# Patient Record
Sex: Male | Born: 1990 | Race: Black or African American | Hispanic: No | Marital: Single | Smoking: Current every day smoker
Health system: Southern US, Community
[De-identification: ages and names within clinical notes are randomized; demographics above are authoritative.]

## PROBLEM LIST (undated history)

## (undated) DIAGNOSIS — J45909 Unspecified asthma, uncomplicated: Secondary | ICD-10-CM

---

## 2016-06-16 ENCOUNTER — Emergency Department (HOSPITAL_COMMUNITY)
Admission: EM | Admit: 2016-06-16 | Discharge: 2016-06-16 | Disposition: A | Discharge status: Home / Self Care | Payer: Self-pay | Attending: Emergency Medicine | Admitting: Emergency Medicine

## 2016-06-16 ENCOUNTER — Emergency Department (HOSPITAL_COMMUNITY): Payer: Self-pay

## 2016-06-16 DIAGNOSIS — J069 Acute upper respiratory infection, unspecified: Secondary | ICD-10-CM | POA: Insufficient documentation

## 2016-06-16 MED ORDER — ALBUTEROL SULFATE HFA 108 (90 BASE) MCG/ACT IN AERS
1.0000 | INHALATION_SPRAY | RESPIRATORY_TRACT | Status: DC | PRN
  Administered 2016-06-16: 1 via RESPIRATORY_TRACT
  Filled 2016-06-16: qty 6.7

## 2016-06-16 MED ORDER — PREDNISONE 20 MG PO TABS: 40.0000 mg | ORAL_TABLET | Freq: Every day | ORAL | 0 refills | Status: AC

## 2016-06-16 MED ORDER — AEROCHAMBER PLUS W/MASK MISC
1.0000 | Freq: Once | Status: AC
  Administered 2016-06-16: 1
  Filled 2016-06-16: qty 1

## 2016-06-16 MED ORDER — BENZONATATE 100 MG PO CAPS: 100.0000 mg | ORAL_CAPSULE | Freq: Three times a day (TID) | ORAL | 0 refills | Status: AC | PRN

## 2016-06-16 MED ORDER — PREDNISONE 20 MG PO TABS
60.0000 mg | ORAL_TABLET | Freq: Once | ORAL | Status: AC
  Administered 2016-06-16: 60 mg via ORAL
  Filled 2016-06-16: qty 3

## 2016-06-16 NOTE — ED Triage Notes (Signed)
Cough and s/s x 2 days it has irritated his his bronchititis , OTC meds not helping

## 2016-06-16 NOTE — ED Notes (Signed)
Pt verbalized understanding discharge instructions and denies any further needs or questions at this time. VS stable, ambulatory and steady gait.   

## 2016-06-16 NOTE — ED Provider Notes (Signed)
[facility]EM[facility]Y DEPT Provider Note   CSN: [id number] Arrival date & time: 06/16/16  1249     History   Chief Complaint Chief Complaint  Patient presents with  . URI    HPI Eric Boyle is a 26 y.o. male.  HPI Patient presents for evaluation of gradual onset, persistent, mild cough. He reports a productive cough for the last 4 days that is becoming worse. He notes yellowish brown sputum. Associated symptoms include myalgias, chills, nasal congestion, shortness of breath, and diffuse chest pain with coughing. No sore throat. He has not tried any medications. He notes a remote history of asthma as a child.  No past medical history on file.  There are no active problems to display for this patient.   No past surgical history on file.     Home Medications    Prior to Admission medications   Medication Sig Start Date End Date Taking? Authorizing Provider  benzonatate (TESSALON) 100 MG capsule Take 1 capsule (100 mg total) by mouth 3 (three) times daily as needed for cough. 06/16/16   Cheri Fowler, PA-C  predniSONE (DELTASONE) 20 MG tablet Take 2 tablets (40 mg total) by mouth daily. 06/16/16   Cheri Fowler, PA-C    Family History No family history on file.  Social History Social History  Substance Use Topics  . Smoking status: Not on file  . Smokeless tobacco: Not on file  . Alcohol use Not on file     Allergies   Patient has no known allergies.   Review of Systems Review of Systems All other systems negative unless otherwise stated in HPI   Physical Exam Updated Vital Signs BP 123/67 (BP Location: Left Arm)   Pulse 78   Temp 98.6 F (37 C) (Oral)   Resp 21   Ht 6' (1.829 m)   Wt 95.3 kg   SpO2 99%   BMI 28.48 kg/m   Physical Exam  Constitutional: He is oriented to person, place, and time. He appears well-developed and well-nourished.  Non-toxic appearance. He does not have a sickly appearance. He does not appear ill.  HENT:  Head: Normocephalic and  atraumatic.  Mouth/Throat: Oropharynx is clear and moist.  Eyes: Conjunctivae are normal.  Neck: Normal range of motion. Neck supple.  Cardiovascular: Normal rate and regular rhythm.   Pulmonary/Chest: Effort normal. No accessory muscle usage or stridor. No respiratory distress. He has wheezes. He has no rhonchi. He has rales.  Abdominal: Soft. Bowel sounds are normal. He exhibits no distension. There is no tenderness.  Musculoskeletal: Normal range of motion.  Lymphadenopathy:    He has no cervical adenopathy.  Neurological: He is alert and oriented to person, place, and time.  Speech clear without dysarthria.  Skin: Skin is warm and dry.  Psychiatric: He has a normal mood and affect. His behavior is normal.     ED Treatments / Results  Labs (all labs ordered are listed, but only abnormal results are displayed) Labs Reviewed - No data to display  EKG  EKG Interpretation None       Radiology Dg Chest 2 View  Result Date: 06/16/2016 CLINICAL DATA:  Cough for 2 days EXAM: CHEST  2 VIEW COMPARISON:  None. FINDINGS: The heart size and mediastinal contours are within normal limits. Both lungs are clear. The visualized skeletal structures are unremarkable. IMPRESSION: No active cardiopulmonary disease. Electronically Signed   By: Elige Ko   On: 06/16/2016 14:23    Procedures Procedures (including critical care time)  Medications Ordered in ED Medications  predniSONE (DELTASONE) tablet 60 mg (not administered)  albuterol (PROVENTIL HFA;VENTOLIN HFA) 108 (90 Base) MCG/ACT inhaler 1 puff (not administered)  aerochamber plus with mask device 1 each (not administered)     Initial Impression / Assessment and Plan / ED Course  I have reviewed the triage vital signs and the nursing notes.  Pertinent labs & imaging results that were available during my care of the patient were reviewed by me and considered in my medical decision making (see chart for details).  Clinical Course      Pt symptoms consistent with URI. CXR negative for acute infiltrate. Pt will be discharged with symptomatic treatment.  Discussed return precautions.  Pt is hemodynamically stable & in NAD prior to discharge.   Final Clinical Impressions(s) / ED Diagnoses   Final diagnoses:  Upper respiratory tract infection, unspecified type    New Prescriptions New Prescriptions   BENZONATATE (TESSALON) 100 MG CAPSULE    Take 1 capsule (100 mg total) by mouth 3 (three) times daily as needed for cough.   PREDNISONE (DELTASONE) 20 MG TABLET    Take 2 tablets (40 mg total) by mouth daily.     Cheri FowlerKayla Latangela Mccomas, PA-C 06/16/16 1526    Shaune Pollackameron Isaacs, MD 06/18/16 [phone]

## 2016-06-16 NOTE — Discharge Instructions (Signed)
Your xray today is normal.  Your symptoms are likely viral in nature.  Take prednisone and the albuterol inhaler to help with shortness of breath.  Take the tessalon perles for cough.  [facility] to establish a primary care physician.  Return to the ED for worsening cough, shortness of breath, chest pain, fever, or any new or concerning symptoms.

## 2016-11-19 ENCOUNTER — Encounter (HOSPITAL_COMMUNITY): Payer: Self-pay | Admitting: Emergency Medicine

## 2016-11-19 ENCOUNTER — Emergency Department (HOSPITAL_COMMUNITY)
Admission: EM | Admit: 2016-11-19 | Discharge: 2016-11-19 | Disposition: A | Discharge status: Home / Self Care | Payer: Self-pay | Attending: Physician Assistant | Admitting: Physician Assistant

## 2016-11-19 DIAGNOSIS — J45909 Unspecified asthma, uncomplicated: Secondary | ICD-10-CM | POA: Insufficient documentation

## 2016-11-19 DIAGNOSIS — M791 Myalgia, unspecified site: Secondary | ICD-10-CM

## 2016-11-19 DIAGNOSIS — F17200 Nicotine dependence, unspecified, uncomplicated: Secondary | ICD-10-CM | POA: Insufficient documentation

## 2016-11-19 DIAGNOSIS — G4489 Other headache syndrome: Secondary | ICD-10-CM | POA: Insufficient documentation

## 2016-11-19 HISTORY — DX: Unspecified asthma, uncomplicated: J45.909

## 2016-11-19 LAB — CBC WITH DIFFERENTIAL/PLATELET
BASOS PCT: 1 %
Basophils Absolute: 0.1 10*3/uL (ref 0.0–0.1)
EOS PCT: 3 %
Eosinophils Absolute: 0.2 10*3/uL (ref 0.0–0.7)
HEMATOCRIT: 44.4 % (ref 39.0–52.0)
Hemoglobin: 15 g/dL (ref 13.0–17.0)
Lymphocytes Relative: 49 %
Lymphs Abs: 3 10*3/uL (ref 0.7–4.0)
MCH: 29 pg (ref 26.0–34.0)
MCHC: 33.8 g/dL (ref 30.0–36.0)
MCV: 85.9 fL (ref 78.0–100.0)
MONO ABS: 0.5 10*3/uL (ref 0.1–1.0)
MONOS PCT: 8 %
NEUTROS ABS: 2.4 10*3/uL (ref 1.7–7.7)
Neutrophils Relative %: 39 %
Platelets: 225 10*3/uL (ref 150–400)
RBC: 5.17 MIL/uL (ref 4.22–5.81)
RDW: 15.1 % (ref 11.5–15.5)
WBC: 6.2 10*3/uL (ref 4.0–10.5)

## 2016-11-19 LAB — BASIC METABOLIC PANEL
Anion gap: 8 (ref 5–15)
BUN: 10 mg/dL (ref 6–20)
CALCIUM: 8.7 mg/dL — AB (ref 8.9–10.3)
CO2: 24 mmol/L (ref 22–32)
CREATININE: 1.24 mg/dL (ref 0.61–1.24)
Chloride: 103 mmol/L (ref 101–111)
GFR calc non Af Amer: >60 mL/min (ref >60)
GLUCOSE: 106 mg/dL — AB (ref 65–99)
Potassium: 3.9 mmol/L (ref 3.5–5.1)
Sodium: 135 mmol/L (ref 135–145)

## 2016-11-19 LAB — URINALYSIS, ROUTINE W REFLEX MICROSCOPIC
Bilirubin Urine: NEGATIVE
GLUCOSE, UA: NEGATIVE mg/dL
HGB URINE DIPSTICK: NEGATIVE
KETONES UR: NEGATIVE mg/dL
LEUKOCYTES UA: NEGATIVE
Nitrite: NEGATIVE
PH: 6 (ref 5.0–8.0)
Protein, ur: NEGATIVE mg/dL
Specific Gravity, Urine: 1.029 (ref 1.005–1.030)

## 2016-11-19 MED ORDER — DOXYCYCLINE HYCLATE 100 MG PO CAPS: 100.0000 mg | ORAL_CAPSULE | Freq: Two times a day (BID) | ORAL | 0 refills | Status: AC

## 2016-11-19 MED ORDER — SODIUM CHLORIDE 0.9 % IV SOLN
INTRAVENOUS | Status: AC
  Administered 2016-11-19: 500 mL/h via INTRAVENOUS

## 2016-11-19 NOTE — Discharge Instructions (Signed)
Your symptoms may be due to a viral illness, however, since you had some type of insect bite and did not see what it was we are treating you for RMSF with Doxycycline while your blood test is pending. Return for worsening symptoms.

## 2016-11-19 NOTE — ED Provider Notes (Signed)
[facility] Provider Note   CSN: [id number] Arrival date & time: 11/19/16  1944   By signing my name below, I, Orpah CobbMaurice Copeland, attest that this documentation has been prepared under the direction and in the presence of [facility] Sherwin Hollingshed, NP-C. Electronically Signed: Orpah CobbMaurice Copeland , ED Scribe. 11/20/16. 5:05 PM.   History   Chief Complaint Chief Complaint  Patient presents with  . Generalized Body Aches    HPI Eric Boyle is a 26 y.o. male who presents to the Emergency Department complaining of constant, worsening generalized body aches with onset x4 days. Pt states that for the past x4 days he has been experiencing generalized body aches and headaches. He also reports fever and chills with the highest measured temperature being 101. Pt reportedly had an insect bite to the R arm which he states he is unsure what bit him. He reports headache, myalgia, nausea, neck pain, neck stiffness. Pt has taken Tylenol Cold, Ibuprofen with mild relief. He denies sore throat, ear pain, vomiting, diarrhea, frequency, dysuria, penile discharge, back pain. Pt denies concern for STD.    The history is provided by the patient. No language interpreter was used.    Past Medical History:  Diagnosis Date  . Asthma     There are no active problems to display for this patient.   History reviewed. No pertinent surgical history.     Home Medications    Prior to Admission medications   Medication Sig Start Date End Date Taking? Authorizing Provider  benzonatate (TESSALON) 100 MG capsule Take 1 capsule (100 mg total) by mouth 3 (three) times daily as needed for cough. 06/16/16   Cheri Fowlerose, Kayla, PA-C  doxycycline (VIBRAMYCIN) 100 MG capsule Take 1 capsule (100 mg total) by mouth 2 (two) times daily. 11/19/16   Janne NapoleonNeese, Delcenia Inman M, NP  predniSONE (DELTASONE) 20 MG tablet Take 2 tablets (40 mg total) by mouth daily. 06/16/16   Cheri Fowlerose, Kayla, PA-C    Family History No family history on file.  Social  History Social History  Substance Use Topics  . Smoking status: Current Every Day Smoker  . Smokeless tobacco: Current User  . Alcohol use No     Allergies   Patient has no known allergies.   Review of Systems Review of Systems  Constitutional: Positive for chills and fever.  HENT: Negative for congestion, ear pain and sore throat.   Respiratory: Negative for shortness of breath.   Cardiovascular: Negative for chest pain.  Gastrointestinal: Negative for abdominal pain, nausea and vomiting.  Genitourinary: Negative for discharge, dysuria and urgency.  Musculoskeletal: Positive for myalgias and neck pain. Negative for back pain.  Skin: Negative for rash.  Neurological: Positive for headaches.  Psychiatric/Behavioral: Negative for confusion. The patient is not nervous/anxious.      Physical Exam Updated Vital Signs BP 135/76   Pulse 92   Temp 100.1 F (37.8 C) (Oral)   Resp 16   Ht 6' (1.829 m)   Wt 99.8 kg (220 lb)   SpO2 99%   BMI 29.84 kg/m   Physical Exam  Constitutional: He is oriented to person, place, and time. He appears well-developed and well-nourished. No distress.  HENT:  Head: Normocephalic.  Right Ear: Tympanic membrane normal.  Left Ear: Tympanic membrane normal.  Nose: Nose normal.  Mouth/Throat: Uvula is midline, oropharynx is clear and moist and mucous membranes are normal. No posterior oropharyngeal edema or posterior oropharyngeal erythema.  Eyes: EOM are normal. Pupils are equal, round, and reactive to light.  Sclera clear.  Neck: Neck supple.  Full ROM of the neck w/o pain. No meningeal signs.  Cardiovascular: Normal rate and regular rhythm.   Pulmonary/Chest: Effort normal and breath sounds normal.  Lungs clear.  Abdominal: Soft. Bowel sounds are normal. There is no tenderness. There is no CVA tenderness.  Musculoskeletal: Normal range of motion.  Lymphadenopathy:    He has no cervical adenopathy.  Neurological: He is alert and oriented  to person, place, and time. No cranial nerve deficit.  Skin: Skin is warm and dry.  Small raised area to the upper R arm that is non-tender.  Psychiatric: He has a normal mood and affect.  Nursing note and vitals reviewed.    ED Treatments / Results   DIAGNOSTIC STUDIES: Oxygen Saturation is 98% on RA, normal by my interpretation.   COORDINATION OF CARE: 5:05 PM-Discussed next steps with pt. Pt verbalized understanding and is agreeable with the plan.    Labs (all labs ordered are listed, but only abnormal results are displayed) Labs Reviewed  BASIC METABOLIC PANEL - Abnormal; Notable for the following:       Result Value   Glucose, Bld 106 (*)    Calcium 8.7 (*)    All other components within normal limits  URINALYSIS, ROUTINE W REFLEX MICROSCOPIC  CBC WITH DIFFERENTIAL/PLATELET  ROCKY MTN SPOTTED FVR ABS PNL(IGG+IGM)     Radiology No results found.  Procedures Procedures (including critical care time)  Medications Ordered in ED Medications  0.9 %  sodium chloride infusion ( Intravenous Stopped 11/19/16 2219)     Initial Impression / Assessment and Plan / ED Course  I have reviewed the triage vital signs and the nursing notes.  Pertinent lab results that were available during my care of the patient were reviewed by me and considered in my medical decision making (see chart for details).   Final Clinical Impressions(s) / ED Diagnoses  26 y.o. male with headache and fever s/p insect bite ? Tick bite stable for d/c without meningeal signs, rash and does not appear toxic. Will treat with doxycycline and patient will f/u with PCP or return here for worsening symptoms. Return precautions discussed. Dr. Corlis Leak in to examine the patient and discuss plan of care.   Final diagnoses:  Myalgia  Other headache syndrome    New Prescriptions Discharge Medication List as of 11/19/2016 11:03 PM    START taking these medications   Details  doxycycline (VIBRAMYCIN) 100 MG  capsule Take 1 capsule (100 mg total) by mouth 2 (two) times daily., Starting Wed 11/19/2016, Print      I personally performed the services described in this documentation, which was scribed in my presence. The recorded information has been reviewed and is accurate.    Kerrie Buffalo [place], [place] 11/20/16 1710    Abelino Derrick, MD 11/21/16 1535

## 2016-11-19 NOTE — ED Triage Notes (Signed)
Pt reports generalized body aches, chills, and fever x 1 week. Highest fever 101. Pt has been taking tylenol for symptoms.

## 2016-11-22 LAB — ROCKY MTN SPOTTED FVR ABS PNL(IGG+IGM)
RMSF IGG: NEGATIVE
RMSF IgM: 0.25 index (ref 0.00–0.89)

## 2018-03-26 IMAGING — CR DG CHEST 2V
2 series · 2 of 2 positions shown · non-contrast
Comparison: None.

CLINICAL DATA: Cough for 2 days

EXAM:
CHEST  2 VIEW

[chest pa]
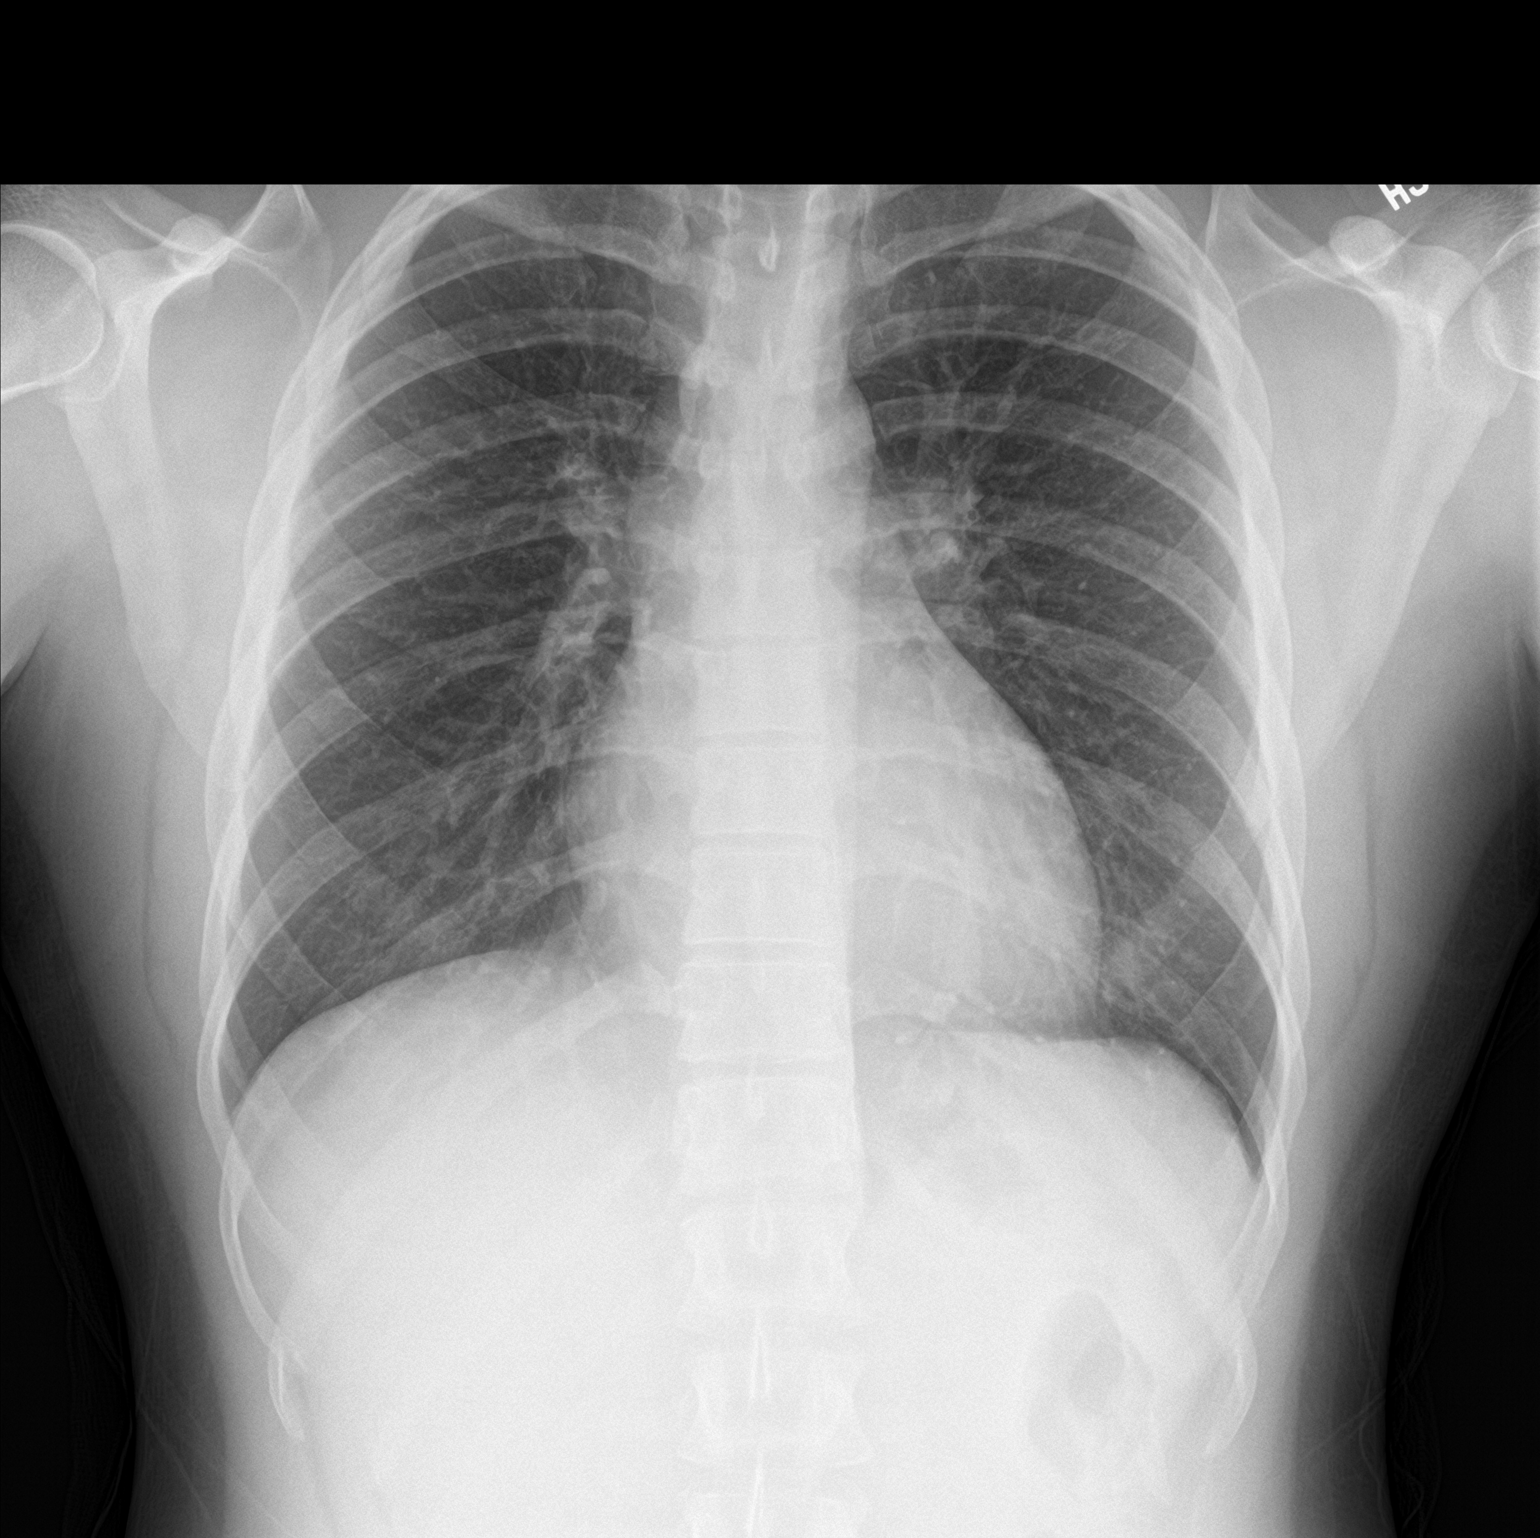

[chest lat]
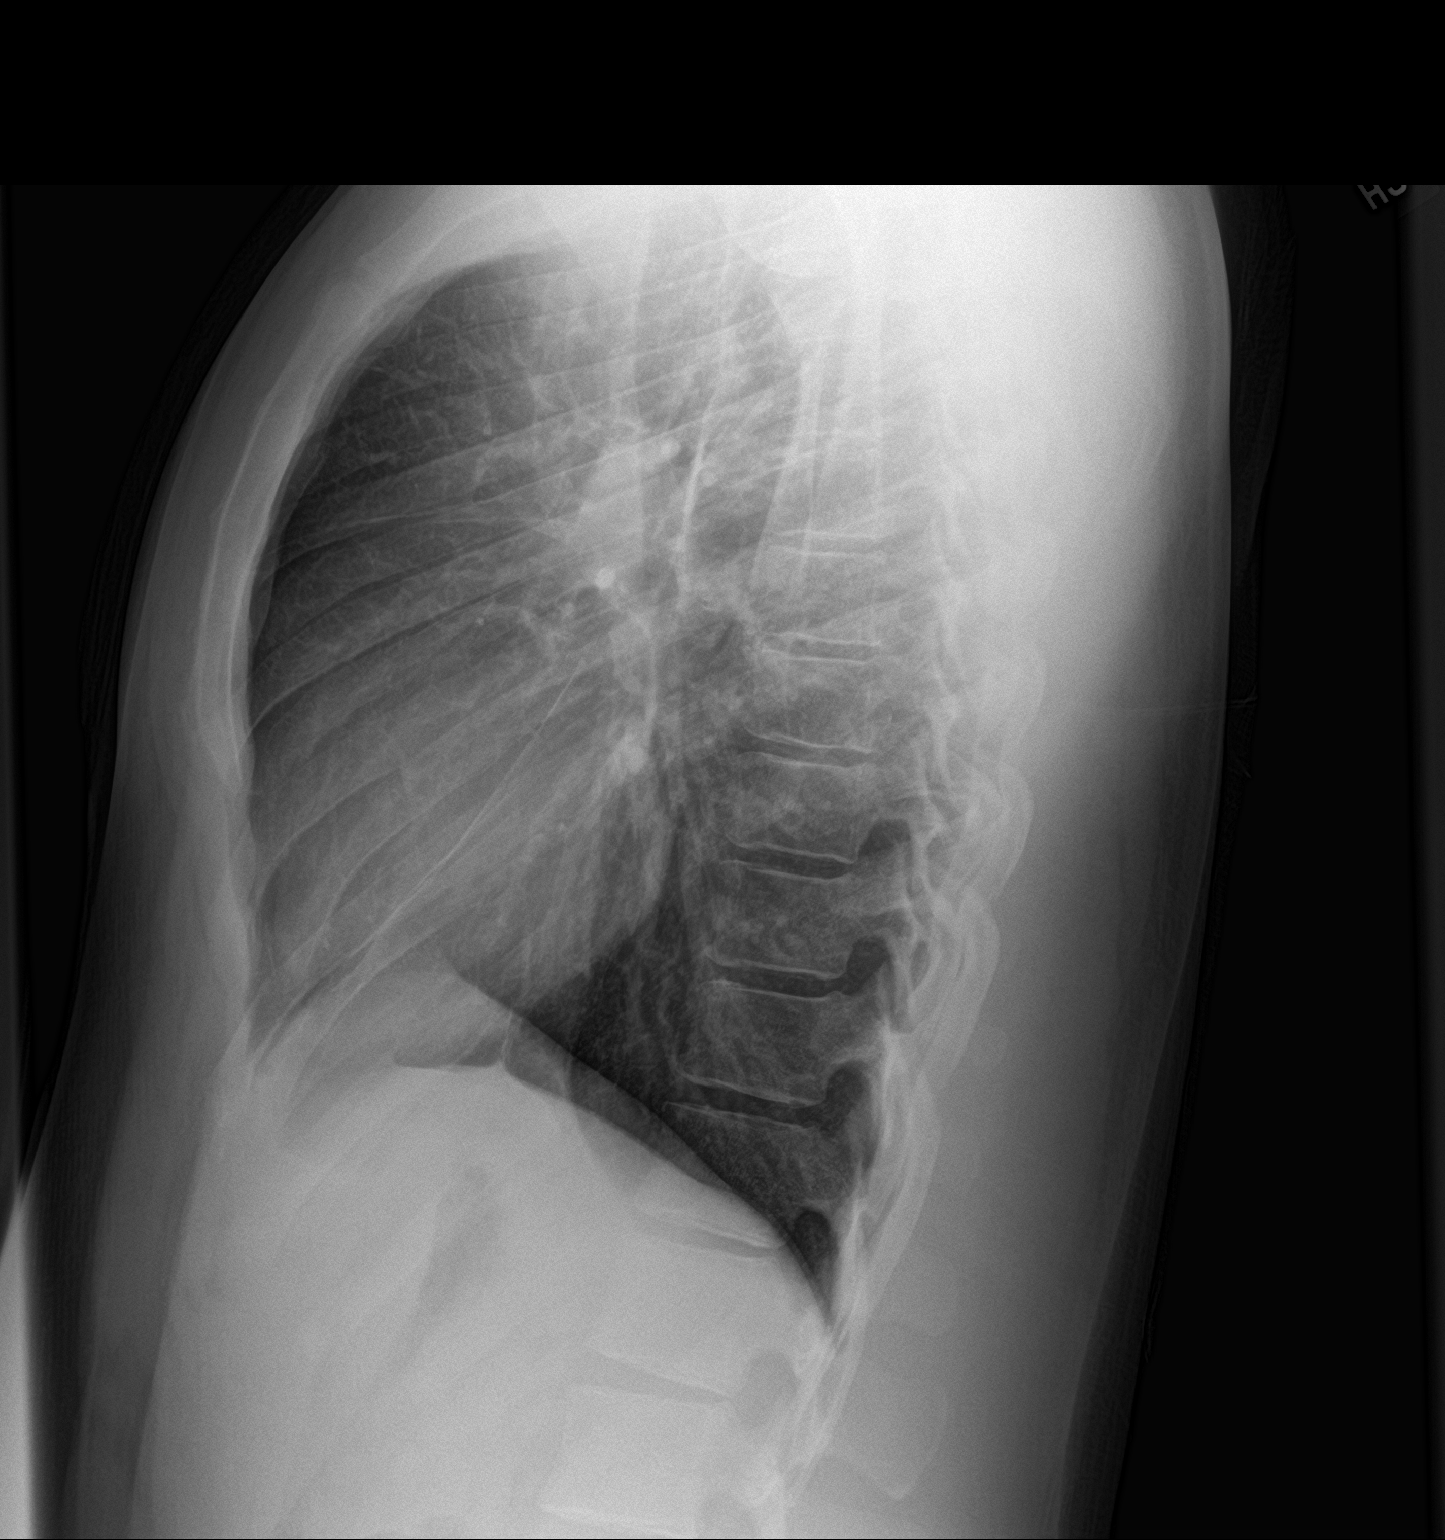

[2 of 2 positions shown; findings below may reference images not displayed]

FINDINGS: The heart size and mediastinal contours are within normal limits.
Both lungs are clear. The visualized skeletal structures are
unremarkable.
IMPRESSION: No active cardiopulmonary disease.
